# Patient Record
Sex: Male | Born: 1985 | Hispanic: Yes | Marital: Single | Smoking: Never smoker
Health system: Southern US, Community
[De-identification: ages and names within clinical notes are randomized; demographics above are authoritative.]

## PROBLEM LIST (undated history)

## (undated) DIAGNOSIS — E119 Type 2 diabetes mellitus without complications: Secondary | ICD-10-CM

---

## 2017-03-18 ENCOUNTER — Ambulatory Visit (HOSPITAL_COMMUNITY)
Admission: EM | Admit: 2017-03-18 | Discharge: 2017-03-18 | Disposition: A | Discharge status: Nursing Home (Includes ICF) | Payer: Self-pay

## 2017-03-18 ENCOUNTER — Encounter (HOSPITAL_COMMUNITY): Payer: Self-pay | Admitting: Emergency Medicine

## 2017-03-18 DIAGNOSIS — Z23 Encounter for immunization: Secondary | ICD-10-CM | POA: Insufficient documentation

## 2017-03-18 DIAGNOSIS — Y9339 Activity, other involving climbing, rappelling and jumping off: Secondary | ICD-10-CM | POA: Insufficient documentation

## 2017-03-18 DIAGNOSIS — S61212A Laceration without foreign body of right middle finger without damage to nail, initial encounter: Secondary | ICD-10-CM | POA: Insufficient documentation

## 2017-03-18 DIAGNOSIS — S81812A Laceration without foreign body, left lower leg, initial encounter: Secondary | ICD-10-CM | POA: Insufficient documentation

## 2017-03-18 DIAGNOSIS — E119 Type 2 diabetes mellitus without complications: Secondary | ICD-10-CM | POA: Insufficient documentation

## 2017-03-18 DIAGNOSIS — W11XXXA Fall on and from ladder, initial encounter: Secondary | ICD-10-CM | POA: Insufficient documentation

## 2017-03-18 DIAGNOSIS — Y999 Unspecified external cause status: Secondary | ICD-10-CM | POA: Insufficient documentation

## 2017-03-18 DIAGNOSIS — Y929 Unspecified place or not applicable: Secondary | ICD-10-CM | POA: Insufficient documentation

## 2017-03-18 NOTE — ED Notes (Signed)
Patient called for room x2 

## 2017-03-18 NOTE — ED Triage Notes (Signed)
Pt. slipped and fell from a ladder this evening presents with abrasions at left lower shin with mild bleeding and abrasion at left posterior thigh with mild swelling , denies LOC / ambulatory .

## 2017-03-18 NOTE — ED Notes (Signed)
Pt called to be placed in room x 1 with no answer

## 2017-03-19 ENCOUNTER — Emergency Department (HOSPITAL_COMMUNITY)
Admission: EM | Admit: 2017-03-19 | Discharge: 2017-03-19 | Disposition: A | Discharge status: Home / Self Care | Payer: Self-pay | Attending: Emergency Medicine | Admitting: Emergency Medicine

## 2017-03-19 ENCOUNTER — Emergency Department (HOSPITAL_COMMUNITY): Payer: Self-pay

## 2017-03-19 DIAGNOSIS — S61212A Laceration without foreign body of right middle finger without damage to nail, initial encounter: Secondary | ICD-10-CM

## 2017-03-19 DIAGNOSIS — W19XXXA Unspecified fall, initial encounter: Secondary | ICD-10-CM

## 2017-03-19 DIAGNOSIS — S81812A Laceration without foreign body, left lower leg, initial encounter: Secondary | ICD-10-CM

## 2017-03-19 HISTORY — DX: Type 2 diabetes mellitus without complications: E11.9

## 2017-03-19 MED ORDER — HYDROCODONE-ACETAMINOPHEN 5-325 MG PO TABS
1.0000 | ORAL_TABLET | Freq: Once | ORAL | Status: AC
  Administered 2017-03-19: 1 via ORAL
  Filled 2017-03-19: qty 1

## 2017-03-19 MED ORDER — IBUPROFEN 800 MG PO TABS
800.0000 mg | ORAL_TABLET | Freq: Once | ORAL | Status: AC
  Administered 2017-03-19: 800 mg via ORAL
  Filled 2017-03-19: qty 1

## 2017-03-19 MED ORDER — LIDOCAINE HCL (PF) 1 % IJ SOLN
2.0000 mL | Freq: Once | INTRAMUSCULAR | Status: AC
  Administered 2017-03-19: 2 mL
  Filled 2017-03-19: qty 5

## 2017-03-19 MED ORDER — TETANUS-DIPHTH-ACELL PERTUSSIS 5-2.5-18.5 LF-MCG/0.5 IM SUSP
0.5000 mL | Freq: Once | INTRAMUSCULAR | Status: AC
  Administered 2017-03-19: 0.5 mL via INTRAMUSCULAR
  Filled 2017-03-19: qty 0.5

## 2017-03-19 NOTE — ED Notes (Signed)
Patient transported to X-ray 

## 2017-03-19 NOTE — ED Provider Notes (Signed)
[facility]-EMERGENCY DEPT Provider Note   CSN: [id number] Arrival date & time: 03/18/17  2010  By signing my name below, I, Garrett Bradford, attest that this documentation has been prepared under the direction and in the presence of Garrett Rhineonald Vernadette Stutsman, MD. Electronically Signed: Elder Negusussell Bradford, Scribe. 03/19/17. 1:18 AM.   History   Chief Complaint Chief Complaint  Patient presents with  . Fall    HPI Garrett Bradford is a 31 y.o. male with history of diabetes who presents to the ED following a fall. This patient states that he was climbing a ladder less than 10 ft above the ground 6 hours ago when he slipped and fell. Landed on his L foot. At interview, he is reporting severe L ankle/lower leg pain. He is unable to bear weight on that side. Also pain to the R 3rd digit. No chest pain or dyspnea. No abdominal pain or nausea. No head trauma or pain.  The history is provided by the patient. No language interpreter was used.    Past Medical History:  Diagnosis Date  . Diabetes mellitus without complication (HCC)     There are no active problems to display for this patient.   History reviewed. No pertinent surgical history.     Home Medications    Prior to Admission medications   Not on File    Family History No family history on file.  Social History Social History  Substance Use Topics  . Smoking status: Never Smoker  . Smokeless tobacco: Never Used  . Alcohol use No     Allergies   Patient has no known allergies.   Review of Systems Review of Systems  Musculoskeletal:       L ankle and R hand pain per HPI     Physical Exam Updated Vital Signs BP (!) 117/54 (BP Location: Left Arm)   Pulse 77   Temp 98.8 F (37.1 C) (Oral)   Resp 16   SpO2 100%   Physical Exam CONSTITUTIONAL: Well developed/well nourished HEAD: Normocephalic/atraumatic, no signs of trauma EYES: EOMI ENMT: Mucous membranes moist NECK: supple no meningeal signs SPINE/BACK:entire spine  nontender CV: S1/S2 noted, no murmurs/rubs/gallops noted LUNGS: Lungs are clear to auscultation bilaterally, no apparent distress ABDOMEN: soft, nontender NEURO: Pt is awake/alert/appropriate, moves all extremitiesx4.  No facial droop.   EXTREMITIES: Small laceration noted to the R middle finger with tenderness to the finger, no deep structures noted, abrasion and significant tenderness to the L distal tibia, distal pulses intact, All other extremities/joints palpated/ranged and nontender SKIN: warm, color normal PSYCH: no abnormalities of mood noted, alert and oriented to situation  ED Treatments / Results  Labs (all labs ordered are listed, but only abnormal results are displayed) Labs Reviewed - No data to display  EKG  EKG Interpretation None       Radiology Dg Tibia/fibula Left  Result Date: 03/19/2017 CLINICAL DATA:  Status post fall, with left leg abrasions and bleeding. Initial encounter. EXAM: LEFT TIBIA AND FIBULA - 2 VIEW COMPARISON:  None. FINDINGS: There is no evidence of fracture or dislocation. The tibia and fibula appear grossly intact. The ankle mortise is incompletely assessed, but appears grossly unremarkable. No definite soft abnormalities are characterized on radiograph. IMPRESSION: No evidence of fracture or dislocation. Electronically Signed   By: Roanna RaiderJeffery  Chang M.D.   On: 03/19/2017 02:40   Dg Finger Middle Right  Result Date: 03/19/2017 CLINICAL DATA:  Slipped and fell from ladder, with injury at the right third finger. Initial encounter.  EXAM: RIGHT MIDDLE FINGER 2+V COMPARISON:  None. FINDINGS: There is no evidence of fracture or dislocation. A soft tissue laceration is noted near the third proximal interphalangeal joint. Visualized joint spaces are grossly preserved. No radiopaque foreign bodies are seen. IMPRESSION: No evidence of fracture or dislocation. Soft tissue laceration noted near the third proximal interphalangeal joint. No radiopaque foreign bodies  seen. Electronically Signed   By: Roanna Raider M.D.   On: 03/19/2017 02:41    Procedures Procedures (including critical care time)  Medications Ordered in ED Medications  ibuprofen (ADVIL,MOTRIN) tablet 800 mg (not administered)  HYDROcodone-acetaminophen (NORCO/VICODIN) 5-325 MG per tablet 1 tablet (1 tablet Oral Given 03/19/17 0133)  Tdap (BOOSTRIX) injection 0.5 mL (0.5 mLs Intramuscular Given 03/19/17 0133)  lidocaine (PF) (XYLOCAINE) 1 % injection 2 mL (2 mLs Other Given by Other 03/19/17 0351)     Initial Impression / Assessment and Plan / ED Course  I have reviewed the triage vital signs and the nursing notes.  Pertinent imaging results that were available during my care of the patient were reviewed by me and considered in my medical decision making (see chart for details).     No acute fx on xray Pain improved, I can palpate left foot/ankle and no tenderness, he only has tenderness around laceration and no deformity or crepitus  No other signs of acute trauma He is well appearing D/w patient utilize interpreter [phone] Given crutches Advised to have sutures out in 10 days Referral to ortho given  Lacerations repaired by our PA Harris, see her note   Final Clinical Impressions(s) / ED Diagnoses   Final diagnoses:  Fall, initial encounter  Laceration of right middle finger without foreign body without damage to nail, initial encounter  Laceration of left lower extremity, initial encounter    New Prescriptions New Prescriptions   No medications on file  I personally performed the services described in this documentation, which was scribed in my presence. The recorded information has been reviewed and is accurate.       Garrett Rhine, MD 03/19/17 [phone]

## 2017-03-19 NOTE — ED Notes (Signed)
Pt ambulated in room; Pt limping with L leg, obvious discomfort noted

## 2017-03-19 NOTE — ED Provider Notes (Signed)
..  Laceration Repair Date/Time: 03/19/2017 5:50 AM Performed by: Arthor CaptainHARRIS, Sommer Spickard Authorized by: Arthor CaptainHARRIS, Wah Sabic   Consent:    Consent given by:  Patient   Risks discussed:  Pain, poor wound healing, infection and retained foreign body   Alternatives discussed:  No treatment Anesthesia (see MAR for exact dosages):    Anesthesia method:  Local infiltration   Local anesthetic:  Lidocaine 1% w/o epi Laceration details:    Location:  Finger   Finger location:  R long finger   Length (cm):  2 Repair type:    Repair type:  Simple Pre-procedure details:    Preparation:  Patient was prepped and draped in usual sterile fashion Exploration:    Contaminated: no   Treatment:    Area cleansed with:  Betadine   Amount of cleaning:  Standard Skin repair:    Repair method:  Sutures   Suture size:  5-0   Suture material:  Prolene   Suture technique:  Simple interrupted   Number of sutures:  3 Approximation:    Vermilion border: well-aligned   Post-procedure details:    Patient tolerance of procedure:  Tolerated well, no immediate complications .Marland Kitchen.Laceration Repair Date/Time: 03/19/2017 5:53 AM Performed by: Arthor CaptainHARRIS, Jayion Schneck Authorized by: Arthor CaptainHARRIS, Gamaliel Charney   Consent:    Consent obtained:  Verbal   Consent given by:  Patient   Risks discussed:  Pain, infection and retained foreign body   Alternatives discussed:  No treatment Anesthesia (see MAR for exact dosages):    Anesthesia method:  Local infiltration   Local anesthetic:  Lidocaine 1% w/o epi Laceration details:    Location:  Leg   Leg location:  L lower leg   Length (cm):  1 Repair type:    Repair type:  Simple Treatment:    Area cleansed with:  Betadine   Amount of cleaning:  Standard Skin repair:    Repair method:  Sutures   Suture size:  3-0   Suture material:  Prolene   Suture technique:  Simple interrupted   Number of sutures:  1 Approximation:    Vermilion border: well-aligned   Post-procedure details:    Patient  tolerance of procedure:  Tolerated well, no immediate complications      Arthor Captainbigail Dlynn Ranes, PA-C 03/19/17 [id number]    Zadie Rhineonald Wickline, MD 03/19/17 [phone]

## 2018-09-04 IMAGING — DX DG TIBIA/FIBULA 2V*L*
3 series · 3 of 3 positions shown · non-contrast
Comparison: None.

CLINICAL DATA: Status post fall, with left leg abrasions and
bleeding. Initial encounter.

EXAM:
LEFT TIBIA AND FIBULA - 2 VIEW

[tibia ap (1 of 2)]
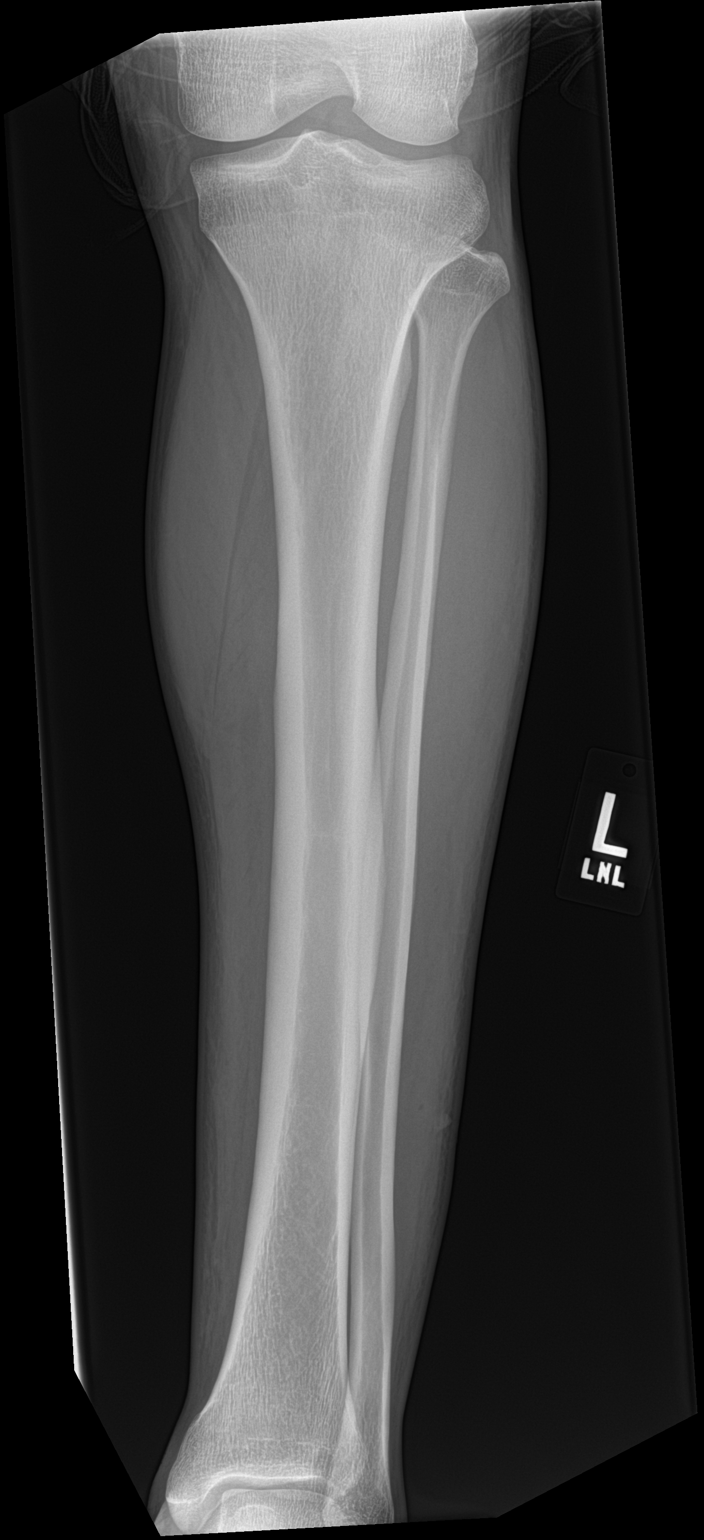

[tibia ap (2 of 2)]
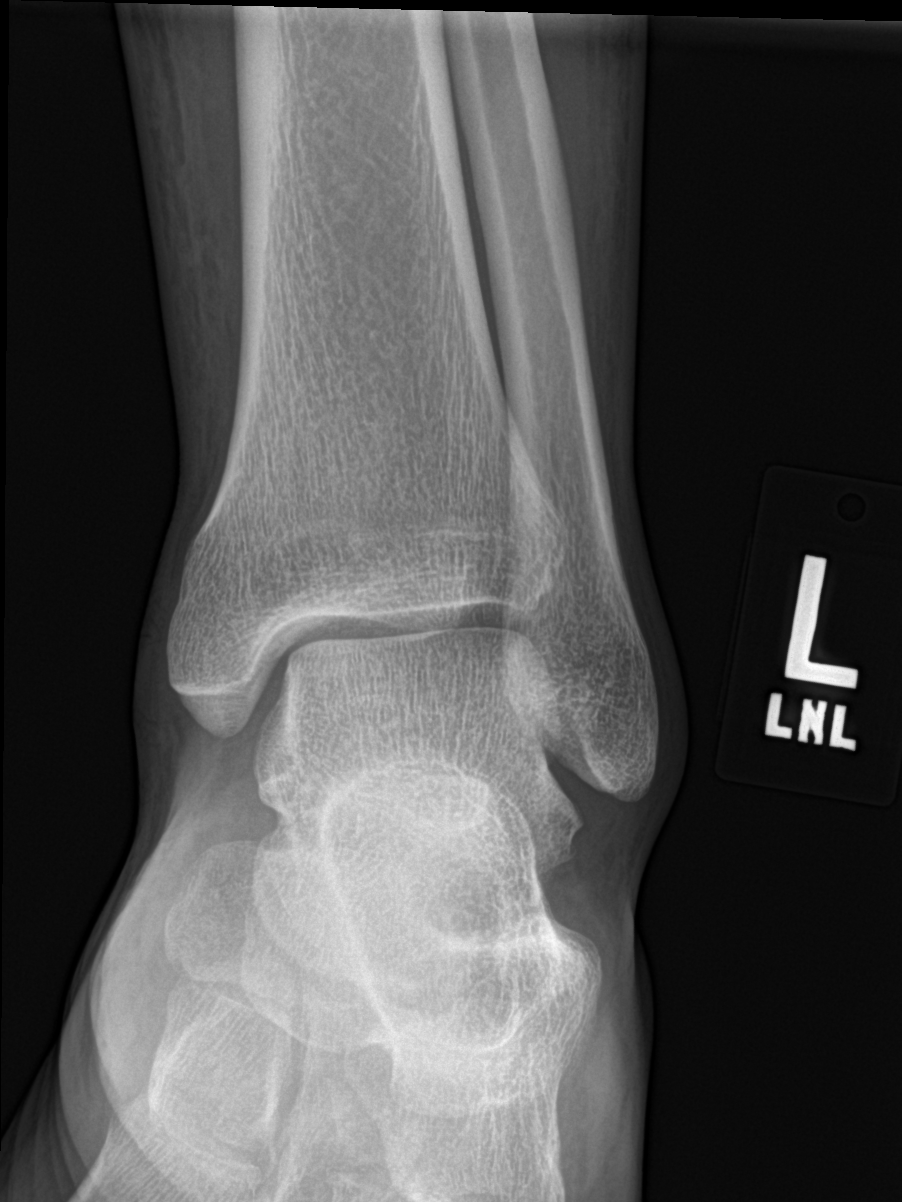

[tibia lat]
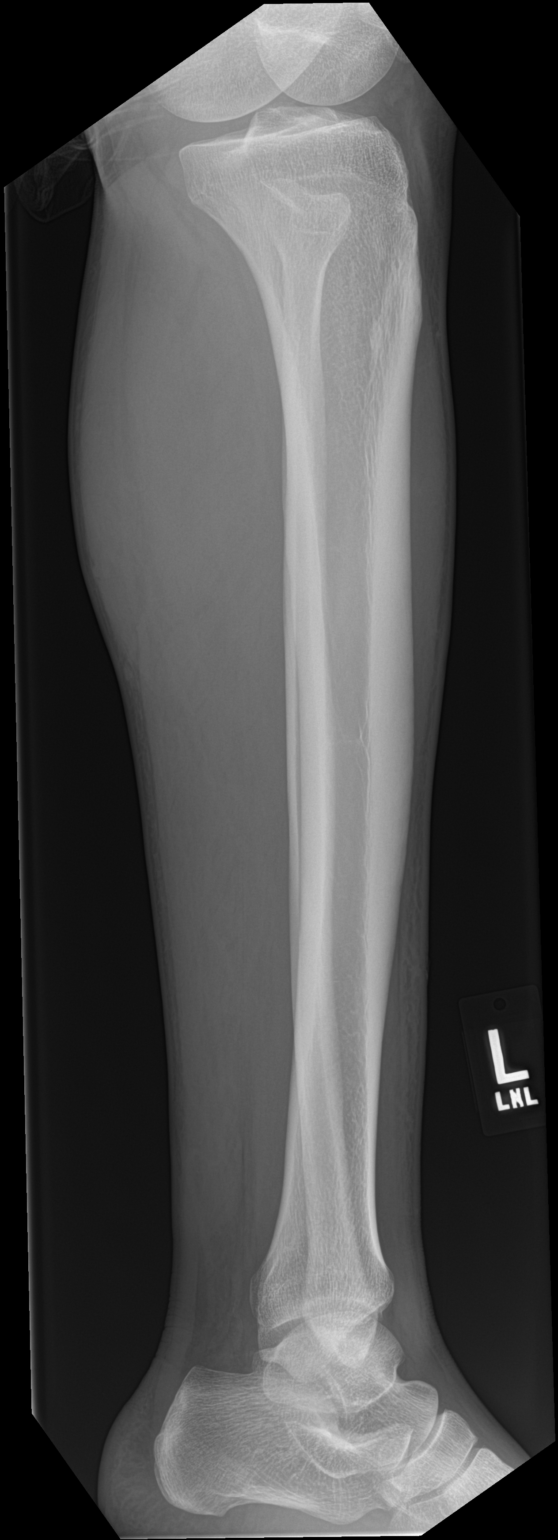

[3 of 3 positions shown; findings below may reference images not displayed]

FINDINGS: There is no evidence of fracture or dislocation. The tibia and
fibula appear grossly intact. The ankle mortise is incompletely
assessed, but appears grossly unremarkable. No definite soft
abnormalities are characterized on radiograph.
IMPRESSION: No evidence of fracture or dislocation.
